# Patient Record
Sex: Male | Born: 1975 | Race: Black or African American | Hispanic: No | Marital: Married | Smoking: Never smoker
Health system: Southern US, Community
[De-identification: ages and names within clinical notes are randomized; demographics above are authoritative.]

## PROBLEM LIST (undated history)

## (undated) HISTORY — PX: APPENDECTOMY: SHX54

## (undated) HISTORY — PX: TONSILLECTOMY: SUR1361

---

## 2008-02-19 ENCOUNTER — Emergency Department (HOSPITAL_COMMUNITY): Admission: EM | Admit: 2008-02-19 | Discharge: 2008-02-19 | Payer: Self-pay | Admitting: Emergency Medicine

## 2013-12-04 ENCOUNTER — Emergency Department (INDEPENDENT_AMBULATORY_CARE_PROVIDER_SITE_OTHER)
Admission: EM | Admit: 2013-12-04 | Discharge: 2013-12-04 | Disposition: A | Discharge status: Home / Self Care | Payer: BC Managed Care – PPO | Source: Home / Self Care | Attending: Emergency Medicine | Admitting: Emergency Medicine

## 2013-12-04 ENCOUNTER — Encounter (HOSPITAL_COMMUNITY): Payer: Self-pay | Admitting: Emergency Medicine

## 2013-12-04 DIAGNOSIS — J40 Bronchitis, not specified as acute or chronic: Secondary | ICD-10-CM | POA: Diagnosis not present

## 2013-12-04 MED ORDER — OMEPRAZOLE 20 MG PO CPDR: 20.0000 mg | DELAYED_RELEASE_CAPSULE | Freq: Every day | ORAL | Status: DC

## 2013-12-04 MED ORDER — DOXYCYCLINE HYCLATE 50 MG PO CAPS: 50.0000 mg | ORAL_CAPSULE | Freq: Two times a day (BID) | ORAL | Status: DC

## 2013-12-04 NOTE — Discharge Instructions (Signed)
You likely had an infection (like a cold or bronchitis) and are just taking a long time to recover. REST as much as you can. Take the antibiotic (doxycycline) 1 pill twice a day for 10 days. Take Omeprazole (an acid blocker) daily for the next 2 weeks to see if it will help with the nighttime cough.  Follow up as needed.

## 2013-12-04 NOTE — ED Notes (Signed)
Pt  Has  Symptoms  Of  Body     Aches         Headache  Vague chest  Pain      Which  Is  intermittant             And  Worse  When he  Takes  A  Deep  Breath            For  sev  Weeks         At  This  Time  He  Is  Sitting  Upright on  Exam table  Speaking in  Complete       sentances  -   Skin  Is  Warm  And  Dry

## 2013-12-04 NOTE — ED Provider Notes (Signed)
CSN: [id number]     Arrival date & time 12/04/13  1502 History   First MD Initiated Contact with Patient 12/04/13 1516     Chief Complaint  Patient presents with  . Generalized Body Aches   (Consider location/radiation/quality/duration/timing/severity/associated sxs/prior Treatment) HPI He is here today with his wife for evaluation of cough and chest discomfort. He has been feeling poorly for the last several weeks, but has been busy with work and travel and unable to see a physician. During the last several weeks he has had intermittent headaches, abdominal discomfort, loose stools, generalized body aches. Also had nasal congestion and rhinorrhea initially. He denies any fevers, although his wife thinks he did have fevers at one point. Denies decreased appetite, nausea, vomiting. He states today he is feeling better. However, he continues to have a cough that is worse at night and some vague intermittent chest discomfort. The chest discomfort is best described as a feeling of tightness and being unable to take a deep breath. He denies any leg swelling or leg pain. He is a nonsmoker. He has no known medical problems. Heart disease does run in his family.  History reviewed. No pertinent past medical history. History reviewed. No pertinent past surgical history. History reviewed. No pertinent family history. History  Substance Use Topics  . Smoking status: Never Smoker   . Smokeless tobacco: Not on file  . Alcohol Use: Yes     Comment: occasonally    Review of Systems  Constitutional: Negative for fever, chills, activity change and appetite change.  HENT: Negative for congestion, ear pain, rhinorrhea, sore throat, trouble swallowing and voice change.   Respiratory: Positive for cough and chest tightness. Negative for wheezing.   Cardiovascular: Positive for chest pain. Negative for palpitations and leg swelling.  Gastrointestinal: Negative.   Genitourinary: Negative.   Musculoskeletal:  Positive for myalgias.  Skin: Negative.   Neurological: Negative.     Allergies  Review of patient's allergies indicates no known allergies.  Home Medications   Prior to Admission medications   Medication Sig Start Date End Date Taking? Authorizing Provider  doxycycline (VIBRAMYCIN) 50 MG capsule Take 1 capsule (50 mg total) by mouth 2 (two) times daily. 12/04/13   Charm RingsErin J Adriel Desrosier, MD  omeprazole (PRILOSEC) 20 MG capsule Take 1 capsule (20 mg total) by mouth daily. 12/04/13   Charm RingsErin J Rual Vermeer, MD   BP 146/92  Pulse 62  Temp(Src) 97.8 F (36.6 C) (Oral)  SpO2 100% Physical Exam  Constitutional: He is oriented to person, place, and time. He appears well-developed and well-nourished. No distress.  HENT:  Head: Normocephalic and atraumatic.  Right Ear: Tympanic membrane and external ear normal.  Left Ear: Tympanic membrane and external ear normal.  Nose: Nose normal.  Mouth/Throat: Oropharynx is clear and moist. No oropharyngeal exudate.  Eyes: Conjunctivae are normal. Pupils are equal, round, and reactive to light. Right eye exhibits no discharge. Left eye exhibits no discharge.  Neck: Normal range of motion. Neck supple.  Cardiovascular: Normal rate, regular rhythm and normal heart sounds.  Exam reveals no friction rub.   No murmur heard. Pulmonary/Chest: Effort normal and breath sounds normal. No respiratory distress. He has no wheezes. He has no rales.  Musculoskeletal: He exhibits no edema and no tenderness.  Lymphadenopathy:    He has no cervical adenopathy.  Neurological: He is alert and oriented to person, place, and time.  Skin: Skin is warm and dry. No rash noted. He is not diaphoretic.  ED Course  ED EKG  Date/Time: 12/04/2013 4:06 PM Performed by: Charm Rings Authorized by: Charm Rings Interpreted by ED physician Previous ECG: no previous ECG available Rhythm: sinus rhythm Rate: normal QRS axis: normal Conduction: conduction normal ST Segments: ST segments  normal T Waves: T waves normal Clinical impression: normal ECG   (including critical care time) Labs Review Labs Reviewed - No data to display  Imaging Review No results found.   MDM   1. Bronchitis    I suspect he is having a prolonged recovery from a viral bronchitis, however given the duration of symptoms will treat with doxycycline. Also provided prescription for omeprazole to see if that'll help with nighttime cough. Recommended rest. No signs of heart failure, PE, cardiac cause on exam. Followup as needed.      Charm Rings, MD 12/04/13 [phone]

## 2014-06-05 ENCOUNTER — Encounter (HOSPITAL_COMMUNITY): Payer: Self-pay | Admitting: Emergency Medicine

## 2014-06-05 ENCOUNTER — Emergency Department (HOSPITAL_COMMUNITY)
Admission: EM | Admit: 2014-06-05 | Discharge: 2014-06-05 | Disposition: A | Discharge status: Home / Self Care | Payer: Self-pay | Source: Home / Self Care | Attending: Emergency Medicine | Admitting: Emergency Medicine

## 2014-06-05 DIAGNOSIS — B349 Viral infection, unspecified: Secondary | ICD-10-CM

## 2014-06-05 DIAGNOSIS — I889 Nonspecific lymphadenitis, unspecified: Secondary | ICD-10-CM

## 2014-06-05 LAB — POCT RAPID STREP A: STREPTOCOCCUS, GROUP A SCREEN (DIRECT): NEGATIVE

## 2014-06-05 MED ORDER — CLINDAMYCIN HCL 300 MG PO CAPS: 300.0000 mg | ORAL_CAPSULE | Freq: Three times a day (TID) | ORAL | Status: DC

## 2014-06-05 MED ORDER — ACETAMINOPHEN 325 MG PO TABS
ORAL_TABLET | ORAL | Status: AC
  Filled 2014-06-05: qty 2

## 2014-06-05 MED ORDER — ACETAMINOPHEN 325 MG PO TABS
650.0000 mg | ORAL_TABLET | Freq: Once | ORAL | Status: AC
  Administered 2014-06-05: 650 mg via ORAL

## 2014-06-05 MED ORDER — SODIUM CHLORIDE 0.9 % IV BOLUS (SEPSIS)
1000.0000 mL | Freq: Once | INTRAVENOUS | Status: AC
  Administered 2014-06-05: 1000 mL via INTRAVENOUS

## 2014-06-05 NOTE — Discharge Instructions (Signed)
You likely have a virus that is causing a mono-like illness.   Your lymph node may be infected. Take Clindamycin 1 pill 3 times a day for 10 days. Drink LOTS of fluids. You should start to feel better in the next 2-3 days.  If you are not improving in 2-3 days, or your symptoms get worse, please come back.

## 2014-06-05 NOTE — ED Notes (Signed)
Fever, minimal cough, general body aches, and headache, scratchy throat.

## 2014-06-05 NOTE — ED Provider Notes (Signed)
CSN: [id number]     Arrival date & time 06/05/14  [id number] History   First MD Initiated Contact with Patient 06/05/14 1008     Chief Complaint  Patient presents with  . Fever   (Consider location/radiation/quality/duration/timing/severity/associated sxs/prior Treatment) HPI  He is a 39 year old man here with his wife for evaluation of fever. He states his fever started on Friday. His wife is giving him Tylenol and Motrin with minimal improvement. He states he's had a little bit of nasal congestion and some mild throat discomfort. He also reports headaches and body aches. He reports some mild shortness of breath. He denies sinus pressure, cough, nausea, vomiting. No rashes. He states his appetite is okay, but his wife reports he has not been taking much by mouth in the last 24 hours. No known sick contacts.  History reviewed. No pertinent past medical history. History reviewed. No pertinent past surgical history. No family history on file. History  Substance Use Topics  . Smoking status: Never Smoker   . Smokeless tobacco: Not on file  . Alcohol Use: Yes     Comment: occasonally    Review of Systems  Constitutional: Positive for fever, activity change, appetite change and fatigue.  HENT: Positive for sore throat. Negative for congestion, rhinorrhea, sinus pressure and trouble swallowing.   Respiratory: Positive for shortness of breath. Negative for cough.   Gastrointestinal: Negative for nausea, vomiting, abdominal pain and diarrhea.  Musculoskeletal: Positive for myalgias.  Neurological: Positive for dizziness and headaches.    Allergies  Review of patient's allergies indicates no known allergies.  Home Medications   Prior to Admission medications   Medication Sig Start Date End Date Taking? Authorizing Provider  clindamycin (CLEOCIN) 300 MG capsule Take 1 capsule (300 mg total) by mouth 3 (three) times daily. 06/05/14   Charm RingsErin J Shastina Rua, MD  doxycycline (VIBRAMYCIN) 50 MG capsule Take  1 capsule (50 mg total) by mouth 2 (two) times daily. 12/04/13   Charm RingsErin J Noralyn Karim, MD  omeprazole (PRILOSEC) 20 MG capsule Take 1 capsule (20 mg total) by mouth daily. 12/04/13   Charm RingsErin J Rahmel Nedved, MD   BP 136/89 mmHg  Pulse 90  Temp(Src) 101.1 F (38.4 C) (Oral)  Resp 18  SpO2 97% Physical Exam  Constitutional: He is oriented to person, place, and time. He appears well-developed and well-nourished. He appears distressed.  HENT:  Head: Normocephalic and atraumatic.  Nose: Rhinorrhea present.  Mouth/Throat: Mucous membranes are dry. Posterior oropharyngeal erythema present. No oropharyngeal exudate.    Neck: Neck supple.  Cardiovascular: Normal rate, regular rhythm and normal heart sounds.   No murmur heard. Pulmonary/Chest: Effort normal and breath sounds normal. No respiratory distress. He has no wheezes. He has no rales.  Lymphadenopathy:    He has cervical adenopathy (right posterior cervical node).  Neurological: He is alert and oriented to person, place, and time.    ED Course  Procedures (including critical care time) Labs Review Labs Reviewed  POCT RAPID STREP A ([facility] ONLY)    Imaging Review No results found.   MDM   1. Viral illness   2. Lymphadenitis    Tylenol 650 mg by mouth given. 1 L normal saline bolus given.  He is much improved after 1 L of fluids. Rapid strep is negative. He likely has a viral illness such as EBV or CMV, with possible lymphadenitis as well. We'll treat with clindamycin for 10 days. Stressed importance of fluid intake. Return precautions given as in after visit  summary.   Charm Rings, MD 06/05/14 1136

## 2014-06-05 NOTE — ED Notes (Signed)
Drinking water and eating saltines and graham crackers prior to discharge, tolerated well

## 2014-06-07 ENCOUNTER — Emergency Department (HOSPITAL_COMMUNITY)
Admission: EM | Admit: 2014-06-07 | Discharge: 2014-06-07 | Disposition: A | Discharge status: Home / Self Care | Payer: Self-pay | Attending: Emergency Medicine | Admitting: Emergency Medicine

## 2014-06-07 ENCOUNTER — Telehealth: Payer: Self-pay | Admitting: *Deleted

## 2014-06-07 ENCOUNTER — Emergency Department (HOSPITAL_COMMUNITY): Payer: Self-pay

## 2014-06-07 ENCOUNTER — Encounter (HOSPITAL_COMMUNITY): Payer: Self-pay | Admitting: Emergency Medicine

## 2014-06-07 DIAGNOSIS — K120 Recurrent oral aphthae: Secondary | ICD-10-CM

## 2014-06-07 DIAGNOSIS — R61 Generalized hyperhidrosis: Secondary | ICD-10-CM | POA: Insufficient documentation

## 2014-06-07 DIAGNOSIS — Z9049 Acquired absence of other specified parts of digestive tract: Secondary | ICD-10-CM | POA: Insufficient documentation

## 2014-06-07 DIAGNOSIS — Z792 Long term (current) use of antibiotics: Secondary | ICD-10-CM | POA: Insufficient documentation

## 2014-06-07 DIAGNOSIS — R0602 Shortness of breath: Secondary | ICD-10-CM | POA: Insufficient documentation

## 2014-06-07 DIAGNOSIS — R42 Dizziness and giddiness: Secondary | ICD-10-CM | POA: Insufficient documentation

## 2014-06-07 DIAGNOSIS — R590 Localized enlarged lymph nodes: Secondary | ICD-10-CM | POA: Insufficient documentation

## 2014-06-07 DIAGNOSIS — R591 Generalized enlarged lymph nodes: Secondary | ICD-10-CM

## 2014-06-07 DIAGNOSIS — M791 Myalgia: Secondary | ICD-10-CM | POA: Insufficient documentation

## 2014-06-07 DIAGNOSIS — R509 Fever, unspecified: Secondary | ICD-10-CM

## 2014-06-07 DIAGNOSIS — R51 Headache: Secondary | ICD-10-CM | POA: Insufficient documentation

## 2014-06-07 DIAGNOSIS — Z79899 Other long term (current) drug therapy: Secondary | ICD-10-CM | POA: Insufficient documentation

## 2014-06-07 DIAGNOSIS — Z8619 Personal history of other infectious and parasitic diseases: Secondary | ICD-10-CM | POA: Insufficient documentation

## 2014-06-07 LAB — CBC WITH DIFFERENTIAL/PLATELET
BASOS PCT: 1 % (ref 0–1)
Basophils Absolute: 0 10*3/uL (ref 0.0–0.1)
Eosinophils Absolute: 0 10*3/uL (ref 0.0–0.7)
Eosinophils Relative: 0 % (ref 0–5)
HEMATOCRIT: 40.5 % (ref 39.0–52.0)
HEMOGLOBIN: 13.7 g/dL (ref 13.0–17.0)
LYMPHS ABS: 0.7 10*3/uL (ref 0.7–4.0)
Lymphocytes Relative: 24 % (ref 12–46)
MCH: 26.3 pg (ref 26.0–34.0)
MCHC: 33.8 g/dL (ref 30.0–36.0)
MCV: 77.7 fL — ABNORMAL LOW (ref 78.0–100.0)
MONOS PCT: 19 % — AB (ref 3–12)
Monocytes Absolute: 0.6 10*3/uL (ref 0.1–1.0)
Neutro Abs: 1.7 10*3/uL (ref 1.7–7.7)
Neutrophils Relative %: 56 % (ref 43–77)
Platelets: 138 10*3/uL — ABNORMAL LOW (ref 150–400)
RBC: 5.21 MIL/uL (ref 4.22–5.81)
RDW: 13.1 % (ref 11.5–15.5)
WBC: 3 10*3/uL — ABNORMAL LOW (ref 4.0–10.5)

## 2014-06-07 LAB — COMPREHENSIVE METABOLIC PANEL
ALT: 28 U/L (ref 0–53)
AST: 46 U/L — AB (ref 0–37)
Albumin: 3.4 g/dL — ABNORMAL LOW (ref 3.5–5.2)
Alkaline Phosphatase: 79 U/L (ref 39–117)
Anion gap: 7 (ref 5–15)
BILIRUBIN TOTAL: 0.9 mg/dL (ref 0.3–1.2)
BUN: 11 mg/dL (ref 6–23)
CO2: 25 mmol/L (ref 19–32)
CREATININE: 0.99 mg/dL (ref 0.50–1.35)
Calcium: 8.3 mg/dL — ABNORMAL LOW (ref 8.4–10.5)
Chloride: 99 mmol/L (ref 96–112)
GFR calc Af Amer: >90 mL/min (ref >90)
GFR calc non Af Amer: >90 mL/min (ref >90)
GLUCOSE: 109 mg/dL — AB (ref 70–99)
POTASSIUM: 3.6 mmol/L (ref 3.5–5.1)
SODIUM: 131 mmol/L — AB (ref 135–145)
Total Protein: 6.9 g/dL (ref 6.0–8.3)

## 2014-06-07 LAB — I-STAT CHEM 8, ED
BUN: 9 mg/dL (ref 6–23)
CALCIUM ION: 1.14 mmol/L (ref 1.12–1.23)
CHLORIDE: 99 mmol/L (ref 96–112)
CREATININE: 1 mg/dL (ref 0.50–1.35)
Glucose, Bld: 111 mg/dL — ABNORMAL HIGH (ref 70–99)
HCT: 45 % (ref 39.0–52.0)
HEMOGLOBIN: 15.3 g/dL (ref 13.0–17.0)
POTASSIUM: 3.7 mmol/L (ref 3.5–5.1)
Sodium: 136 mmol/L (ref 135–145)
TCO2: 23 mmol/L (ref 0–100)

## 2014-06-07 LAB — URINALYSIS, ROUTINE W REFLEX MICROSCOPIC
BILIRUBIN URINE: NEGATIVE
Glucose, UA: NEGATIVE mg/dL
HGB URINE DIPSTICK: NEGATIVE
KETONES UR: 15 mg/dL — AB
LEUKOCYTES UA: NEGATIVE
NITRITE: NEGATIVE
PH: 6.5 (ref 5.0–8.0)
Protein, ur: NEGATIVE mg/dL
SPECIFIC GRAVITY, URINE: 1.024 (ref 1.005–1.030)
Urobilinogen, UA: 1 mg/dL (ref 0.0–1.0)

## 2014-06-07 LAB — I-STAT CG4 LACTIC ACID, ED: LACTIC ACID, VENOUS: 0.9 mmol/L (ref 0.5–2.0)

## 2014-06-07 LAB — CULTURE, GROUP A STREP

## 2014-06-07 LAB — MONONUCLEOSIS SCREEN: MONO SCREEN: NEGATIVE

## 2014-06-07 LAB — RAPID HIV SCREEN (WH-MAU): SUDS RAPID HIV SCREEN: NONREACTIVE

## 2014-06-07 MED ORDER — MAGIC MOUTHWASH W/LIDOCAINE: 5.0000 mL | Freq: Three times a day (TID) | ORAL | Status: DC | PRN

## 2014-06-07 MED ORDER — SODIUM CHLORIDE 0.9 % IV BOLUS (SEPSIS)
1000.0000 mL | Freq: Once | INTRAVENOUS | Status: AC
  Administered 2014-06-07: 1000 mL via INTRAVENOUS

## 2014-06-07 MED ORDER — KETOROLAC TROMETHAMINE 30 MG/ML IJ SOLN
30.0000 mg | Freq: Once | INTRAMUSCULAR | Status: AC
  Administered 2014-06-07: 30 mg via INTRAVENOUS
  Filled 2014-06-07: qty 1

## 2014-06-07 MED ORDER — AMOXICILLIN 500 MG PO CAPS: 500.0000 mg | ORAL_CAPSULE | Freq: Three times a day (TID) | ORAL | Status: DC

## 2014-06-07 NOTE — ED Provider Notes (Signed)
CSN: [id number]     Arrival date & time 06/07/14  0503 History   First MD Initiated Contact with Patient 06/07/14 0600     Chief Complaint  Patient presents with  . Fever     (Consider location/radiation/quality/duration/timing/severity/associated sxs/prior Treatment) HPI Comments: The patient is a 39 year old male in good health presenting emergency room chief complaint of persistent fever for 6 days. Patient reports MAXIMUM TEMPERATURE 102.6 6 days ago. He reports persistent fever unrelieved with Tylenol 650, last dose 0400 today. Denies fever pattern, just persistent all day and night. The patient reports persistent fever with associated generalized myalgias and headache. He reports lightheadedness and diaphoresis, shortness of breath with sitting up. He reports mild sneezing denies nasal congestion, dysuria, hematuria, nausea, vomiting, abdominal pain, rash, recent travel.  The patient reports decrease in oral intake, normal bowel movements. No known sick contacts, no recent travel, no immunocompromised state, no IV drug use. Patient reports recently seen at urgent care 2 days ago for similar complaints without resolution of symptoms.  Reports history of fever blisters. Reports painful oral lesions for several days. NO PCP  The history is provided by the patient. No language interpreter was used.    History reviewed. No pertinent past medical history. Past Surgical History  Procedure Laterality Date  . Appendectomy    . Tonsillectomy     No family history on file. History  Substance Use Topics  . Smoking status: Never Smoker   . Smokeless tobacco: Not on file  . Alcohol Use: Yes     Comment: occasonally    Review of Systems  Constitutional: Positive for fever, chills and diaphoresis.  HENT: Positive for mouth sores. Negative for congestion, ear pain and postnasal drip.   Respiratory: Positive for shortness of breath. Negative for cough.   Gastrointestinal: Negative for nausea,  vomiting, abdominal pain, diarrhea and constipation.  Genitourinary: Negative for dysuria and hematuria.  Musculoskeletal: Positive for myalgias.  Skin: Negative for color change and rash.  Neurological: Positive for light-headedness and headaches. Negative for numbness.      Allergies  Review of patient's allergies indicates no known allergies.  Home Medications   Prior to Admission medications   Medication Sig Start Date End Date Taking? Authorizing Provider  acetaminophen (TYLENOL) 500 MG tablet Take 1,000 mg by mouth every 4 (four) hours as needed (for fever).   Yes Historical Provider, MD  clindamycin (CLEOCIN) 300 MG capsule Take 1 capsule (300 mg total) by mouth 3 (three) times daily. 06/05/14  Yes Charm Rings, MD  doxycycline (VIBRAMYCIN) 50 MG capsule Take 1 capsule (50 mg total) by mouth 2 (two) times daily. 12/04/13   Charm Rings, MD  omeprazole (PRILOSEC) 20 MG capsule Take 1 capsule (20 mg total) by mouth daily. 12/04/13   Charm Rings, MD   BP 116/76 mmHg  Pulse 88  Temp(Src) 100.3 F (37.9 C) (Oral)  Resp 18  Ht  (1.905 m)  Wt 210 lb (95.255 kg)  BMI 26.25 kg/m2  SpO2 99% Physical Exam  Constitutional: He is oriented to person, place, and time. He appears well-developed and well-nourished. No distress.  HENT:  Head: Normocephalic and atraumatic.  Right Ear: No drainage. Tympanic membrane is injected.  Left Ear: No drainage. Tympanic membrane is not injected.  Mouth/Throat:    Ulceration to right posterior oropharynx, and right lip.  Eyes: EOM are normal. Pupils are equal, round, and reactive to light.  Neck: Normal range of motion. Neck supple. No rigidity.  Cardiovascular: Normal rate and regular rhythm.   No murmur heard. Pulmonary/Chest: Effort normal. No respiratory distress. He has no wheezes. He has no rales.  Abdominal: Soft. There is no tenderness. There is no rebound and no guarding.  Lymphadenopathy:       Head (right side): Submandibular  adenopathy present.       Head (left side): No submandibular adenopathy present.    He has cervical adenopathy.       Right cervical: Posterior cervical adenopathy present.       Left cervical: Posterior cervical adenopathy present.  Neurological: He is oriented to person, place, and time.  Skin: Skin is warm and dry. No rash noted.  No lesions to palms or soles.  Psychiatric: He has a normal mood and affect. His behavior is normal.  Nursing note and vitals reviewed.   ED Course  Procedures (including critical care time) Labs Review Labs Reviewed  CBC WITH DIFFERENTIAL/PLATELET - Abnormal; Notable for the following:    WBC 3.0 (*)    MCV 77.7 (*)    Platelets 138 (*)    Monocytes Relative 19 (*)    All other components within normal limits  URINALYSIS, ROUTINE W REFLEX MICROSCOPIC - Abnormal; Notable for the following:    Ketones, ur 15 (*)    All other components within normal limits  COMPREHENSIVE METABOLIC PANEL - Abnormal; Notable for the following:    Sodium 131 (*)    Glucose, Bld 109 (*)    Calcium 8.3 (*)    Albumin 3.4 (*)    AST 46 (*)    All other components within normal limits  I-STAT CHEM 8, ED - Abnormal; Notable for the following:    Glucose, Bld 111 (*)    All other components within normal limits  MONONUCLEOSIS SCREEN  RAPID HIV SCREEN ([facility])  I-STAT CG4 LACTIC ACID, ED    Imaging Review No results found.   EKG Interpretation None      MDM   Final diagnoses:  Fever in adult  Aphthous ulcer of mouth  Lymphadenopathy   Patient with persistent fever for 6 days, diagnosed with viral illness but prescribed antibiotics at urgent care. No meningitis signs. No obvious source of infection, plan to rule out mono, HIV, hepatitis. negative rapid strep and culture performed 3 days ago. Labs show low WBC 3.0. No comparison.  CMP shoes slightly low Na, fluid given, negative mono, negative HIV, negative chest XR and negative urine.  8:03 AM Reevaluation  patient resting complain room reports moderate resolution of symptoms, denies headache at this time.   Re-eval pt resting in room reports persistent resolution of symptoms. Discussed lab results and need to follow-up with PCP for repeat CBC and 7 days.  Also to re-evaluate lymphadenopathy for resolution vs neoplastic disease. Given oral lesions plan to treat for possible coxsackie viral illness.  Meds given in ED:  Medications  sodium chloride 0.9 % bolus 1,000 mL (0 mLs Intravenous Stopped 06/07/14 0921)  sodium chloride 0.9 % bolus 1,000 mL (0 mLs Intravenous Stopped 06/07/14 0738)  ketorolac (TORADOL) 30 MG/ML injection 30 mg (30 mg Intravenous Given 06/07/14 0735)    Discharge Medication List as of 06/07/2014  9:24 AM    START taking these medications   Details  Alum & Mag Hydroxide-Simeth (MAGIC MOUTHWASH W/LIDOCAINE) SOLN Take 5 mLs by mouth 3 (three) times daily as needed for mouth pain., Starting 06/07/2014, Until Discontinued, Print    amoxicillin (AMOXIL) 500 MG capsule Take 1 capsule (  500 mg total) by mouth 3 (three) times daily., Starting 06/07/2014, Until Discontinued, Print        Mellody DrownLauren Rommie Dunn, PA-C 06/07/14 1600  Loren Raceravid Yelverton, MD 06/10/14 [id number]

## 2014-06-07 NOTE — Telephone Encounter (Signed)
Pharmacy called to get ratio for magic mouthwash.  NCM gave her : Maalox Extra Strength; 60ml Nystatin Suspension; Benedryl; 40ml 2% Lidocaine.

## 2014-06-07 NOTE — ED Notes (Addendum)
Patient c/o fever (highest 102.9), dizziness, decreased appetite, SOB x3 days. Patient c/o generalized pain, denies N/V/D.

## 2014-06-07 NOTE — ED Notes (Signed)
Pt c/o fever, general malaise, +nausea, +shob x 6 days. Pt has been taking APAP q4 hours fever continues to be 102-103. Pt is also taking Clindamycin. [facility]

## 2014-06-07 NOTE — Discharge Instructions (Signed)
Call for a follow up appointment with a Family or Primary Care Provider, to repeat CBC blood work in 1 week. Return if Symptoms worsen.   Take medication as prescribed.  You can alternate Ibuprofen 800 mg and tylenol 650 mg for fever and body aches. Salt water gargles for mouth ulcers. Drink plenty of fluid.   Emergency Department Resource Guide 1) Find a Doctor and Pay Out of Pocket Although you won't have to find out who is covered by your insurance plan, it is a good idea to ask around and get recommendations. You will then need to call the office and see if the doctor you have chosen will accept you as a new patient and what types of options they offer for patients who are self-pay. Some doctors offer discounts or will set up payment plans for their patients who do not have insurance, but you will need to ask so you aren't surprised when you get to your appointment.  2) Contact Your Local Health Department Not all health departments have doctors that can see patients for sick visits, but many do, so it is worth a call to see if yours does. If you don't know where your local health department is, you can check in your phone book. The CDC also has a tool to help you locate your state's health department, and many state websites also have listings of all of their local health departments.  3) Find a Walk-in Clinic If your illness is not likely to be very severe or complicated, you may want to try a walk in clinic. These are popping up all over the country in pharmacies, drugstores, and shopping centers. They're usually staffed by nurse practitioners or physician assistants that have been trained to treat common illnesses and complaints. They're usually fairly quick and inexpensive. However, if you have serious medical issues or chronic medical problems, these are probably not your best option.  No Primary Care Doctor: - Call Health Connect at  [phone] - they can help you locate a primary care  doctor that  accepts your insurance, provides certain services, etc. - Physician Referral Service- [phone]  Chronic Pain Problems: Organization         Address  Phone   Notes  [facility]  [phone] Patients need to be referred by their primary care doctor.   Medication Assistance: Organization         Address  Phone   Notes  [facility] [address] [place], [place] [address] [phone]) [phone] --Must be a resident of [place] -- Must have NO insurance coverage whatsoever (no Medicaid/ Medicare, etc.) -- The pt. MUST have a primary care doctor that directs their care regularly and follows them in the community   MedAssist  [phone]) [phone]   [employer]  [phone]    Agencies that provide inexpensive medical care: Organization         Address  Phone   Notes  [facility]  [phone]) [phone]   [facility]    [phone]   [facility] [address] [place], [place] [address] [phone]) [phone]   [facility] [address], [place] [phone]   [facility]anned [facility]    [phone]   [facility]    [phone]   [facility]  [address], [place] Phone:  [phone], Fax:  [phone] Hours of Operation:  9 am - 6 pm, M-F.  Also accepts Medicaid/Medicare and self-pay.  [facility] for [place] [place], [address], [place] Phone: [phone], Fax: [phone]. Hours of Operation:  8:30 am - 5:30 pm, M-F.  Also accepts Medicaid and self-pay.  [facility] [address] [place] Phone: [phone]   [place], [place], [place] [phone], Ext. [phone] Mondays & Thursdays: 7-9 AM.  First 15 patients are seen on a first come, first serve basis.    [place]  Providers:  Organization         Address  Phone   Notes  [facility] [address], [place] [phone] Also accepts self-pay patients.  [facility] [address], [place]  [phone]   [place], [address], [place] [phone]   [facility] [address], [place] [phone]   Lucianne Lei [address], [place]   [phone] Only accepts [place] Access [place] patients after they have their name applied to their card.   Self-Pay (no insurance) in [facility]:  Organization         Address  Phone   Notes  Sickle Cell Patients, [facility] [place] [phone]   [facility] [place] [phone]   [facility] [place]  [place], [place], [place] [phone]   [facility]/Dr. Osei-Bonsu  [address], [place] or [address], [place] [phone] Phone number for both [place] and [place] locations is the same.  Urgent Medical and [facility] [address], [place] [phone]   [facility] [address] [place] or [address] [phone]   [facility] [address], [place] [phone], phone; [phone], fax Sees patients 1st and 3rd Saturday of every month.  Must not qualify for public or private insurance (i.e. Medicaid, Medicare, [place] Health Choice, Veterans' Benefits)  Household income should be no more than 200% of the poverty level The clinic cannot treat you if you are pregnant or think you are pregnant  Sexually transmitted diseases are not treated at the clinic.    Dental Care: Organization         Address  Phone  Notes  [facility] Department of [facility] [place] [phone] Accepts children up to age 38 who are enrolled in [place] or [place]; pregnant women with a Medicaid card; and children who have applied for Medicaid or [place] Health Choice, but were declined, whose parents can pay a reduced fee at time of service.  [facility] Department of [facility]  [address], [place] [phone] Accepts children up to age 17 who are enrolled in [place] or [place]; pregnant women with a Medicaid card; and children who have applied for Medicaid or [place] Health Choice, but were declined, whose parents can pay a reduced fee at time of service.  [facility] Access PROGRAM  [place] [phone] Patients are seen by appointment only. Walk-ins are not accepted. [place]  [facility] will see patients 74 years of age and older. Monday - Tuesday (8am-5pm) Most Wednesdays (8:30-5pm) $30 per visit, cash only  [facility] Adult Dental Access PROGRAM  [address], [facility] [phone] Patients are seen by appointment only. Walk-ins are not accepted. [place] will see patients 50 years of age and older. One Wednesday Evening (Monthly: Volunteer Based).  $30 per visit, cash only  [place]  [phone] for adults; Children under age 23, call Graduate Pediatric Dentistry at [phone]. Children aged 20-14, please call [phone] to request a pediatric application.  Dental services are provided in all areas of dental care including fillings, crowns and bridges, complete and partial dentures, implants, gum treatment, root canals, and extractions. Preventive care is also provided. Treatment is provided to both adults and children. Patients are selected via a lottery and there is often a waiting list.   [facility] [address], [place]  [phone] [email or web address]   [facility]  [address] [place], [place] [phone], Ext. [phone] Second and Fourth Thursday of each month, opens at 6:30 AM; Clinic ends at 9 AM.  Patients are seen on a first-come first-served basis, and a limited number are seen during each clinic.   [facility]  [address] Hillard Danker [place], [place] [phone]   Eligibility Requirements You must have lived in [place], [place], or [place] counties for at least the last three months.   You cannot be eligible for state or federal sponsored [employer], including [employer], [place], or [employer].   You generally cannot be eligible for healthcare insurance through your employer.    How to apply: Eligibility screenings are held every Tuesday and Wednesday afternoon from 1:00 pm until 4:00 pm. You do not need an appointment for the interview!  [facility] [address], [place], [place]   [place]  [place] Department  [place]  [phone]    Behavioral Health Resources in the Community: Intensive Outpatient Programs Organization         Address  Phone  Notes  [place]. [address] [place], [place] [phone]   [facility] Outpatient [address] [place], [place]   ADS: Alcohol [facility] Drug S[facility] [address], [place], [place]   [place] [address]  [place], [place] or [phone]   Substance Abuse Resources Organization         Address  Phone  Notes  Alcohol and Drug Services  [phone]   [place]  [phone]   The [place]   Chinita Pester  [phone]   Residential & Outpatient Substance Abuse Program  [phone]   Psychological Services Organization         Address  Phone  Notes  [facility] [place]  [place]  [phone]   [place] [address] [place] or [phone]    Mobile Crisis Teams Organization         Address  Phone  Notes  [facility], Mobile Crisis Care Unit  [phone]   [facility] Psychotherapeutic [facility]  [address] [place], [place]   [facility] [address] [place] [phone]    Self-Help/Support Groups Organization  Address  Phone             Notes  [place]. [facility] [place] - variety of support groups  Lucien Call for more information  Narcotics Anonymous (NA), [facility] [address], [employer] [place]  2 meetings at this location   Special educational needs teacher         Address  Phone  Notes  [facility] [place],    [place]  [phone]   [facility]  [address] [place] [address]462, [place], [place]   [place], [place] [phone] Admissions: 8am-3pm M-F  Incentives Substance [place] [address],    [place], [place] [phone]   [facility] [address] [place], [place], [place]   [facility] [address],  [place], [place]   [facility] - Intensive Outpatient [address], [place], [place]   [facility] [place].,  [place], [place] [phone] or [phone]   [facility] Services (RTS) [address], [place], [place] Accepts Medicaid  [facility] [address],  [place] [phone] Substance Abuse/Addiction Treatment   [facility] Organization         Address  Phone  Notes  [facility]  [phone]   Domenic Schwab, PhD [address] Arlis Porta [place], [place]   [phone] or [phone]    [address] [facility] [place], [place] [phone]   [facility] [address], [place], [place] [phone] Insurance/Medicaid/sponsorship through [facility] and Families [address], [address] [place]                                    [place], [place] [phone] [place] [address] [place], [place] [phone]    Dr. Adele Schilder  [phone]   [facility] Dept. 1) [address] [place] 2) [place] 3)  [place] [address], [place] [phone]  [phone]   [place] [phone] or [phone] (After Hours)

## 2014-06-07 NOTE — ED Notes (Signed)
Rapid HIV result is non reactive. Carlos [facility]hon in [facility]

## 2017-10-18 ENCOUNTER — Encounter (HOSPITAL_COMMUNITY): Payer: Self-pay | Admitting: Emergency Medicine

## 2017-10-18 ENCOUNTER — Telehealth (HOSPITAL_COMMUNITY): Payer: Self-pay | Admitting: Emergency Medicine

## 2017-10-18 ENCOUNTER — Ambulatory Visit (HOSPITAL_COMMUNITY)
Admission: EM | Admit: 2017-10-18 | Discharge: 2017-10-18 | Disposition: A | Discharge status: Home / Self Care | Payer: Self-pay | Attending: Family Medicine | Admitting: Family Medicine

## 2017-10-18 DIAGNOSIS — R197 Diarrhea, unspecified: Secondary | ICD-10-CM | POA: Insufficient documentation

## 2017-10-18 MED ORDER — CHOLESTYRAMINE 4 G PO PACK: 4.0000 g | PACK | Freq: Two times a day (BID) | ORAL | 0 refills | Status: DC

## 2017-10-18 NOTE — ED Triage Notes (Signed)
Pt c/o diarrhea x1.5 weeks.

## 2017-10-18 NOTE — Telephone Encounter (Signed)
Please call patient or wife, [name], with results of GI panel.  Phone number is [phone]

## 2017-10-18 NOTE — ED Provider Notes (Addendum)
[facility]   [id number] 10/18/17 Arrival Time: 1209  SUBJECTIVE:  Carlos Mcmahon is a 42 y.o. male who presents with complaint of worsening diarrhea that began abruptly 1.5 weeks ago.  Denies a precipitating event, recent antibiotic use, or traveling.  Denies anyone in the home with similar symptoms.  Described the diarrhea as watery, and minimal to no formed stools.  Complains of some mild associated abdominal discomfort he describes as a "muscle ache."  Has tried OTC medications including immodium, pepto, and antidiarrhea medication without relief.  Denies alleviating factors.  Symptoms are made worse with eating and drinking.  Denies similar symptoms in the past.  Last BM this am.  Complains of fatigue, decreased appetite, and 10lbs weight loss.    Denies fever, chills,  nausea, vomiting, chest pain, SOB,constipation, hematochezia, melena, dysuria, difficulty urinating, increased frequency or urgency, flank pain, loss of bowel or bladder function.  No LMP for male patient.  ROS: As per HPI.  History reviewed. No pertinent past medical history. Past Surgical History:  Procedure Laterality Date  . APPENDECTOMY    . TONSILLECTOMY     No Known Allergies No current facility-administered medications on file prior to encounter.    Current Outpatient Medications on File Prior to Encounter  Medication Sig Dispense Refill  . acetaminophen (TYLENOL) 500 MG tablet Take 1,000 mg by mouth every 4 (four) hours as needed (for fever).    . Alum & Mag Hydroxide-Simeth (MAGIC MOUTHWASH W/LIDOCAINE) SOLN Take 5 mLs by mouth 3 (three) times daily as needed for mouth pain. (Patient not taking: Reported on 10/18/2017) 30 mL 0  . amoxicillin (AMOXIL) 500 MG capsule Take 1 capsule (500 mg total) by mouth 3 (three) times daily. (Patient not taking: Reported on 10/18/2017) 21 capsule 0   Social History   Socioeconomic History  . Marital status: Married    Spouse name: Not on file  . Number of  children: Not on file  . Years of education: Not on file  . Highest education level: Not on file  Occupational History  . Not on file  Social Needs  . Financial resource strain: Not on file  . Food insecurity:    Worry: Not on file    Inability: Not on file  . Transportation needs:    Medical: Not on file    Non-medical: Not on file  Tobacco Use  . Smoking status: Never Smoker  Substance and Sexual Activity  . Alcohol use: Yes    Comment: occasonally  . Drug use: No  . Sexual activity: Not on file  Lifestyle  . Physical activity:    Days per week: Not on file    Minutes per session: Not on file  . Stress: Not on file  Relationships  . Social connections:    Talks on phone: Not on file    Gets together: Not on file    Attends religious service: Not on file    Active member of club or organization: Not on file    Attends meetings of clubs or organizations: Not on file    Relationship status: Not on file  . Intimate partner violence:    Fear of current or ex partner: Not on file    Emotionally abused: Not on file    Physically abused: Not on file    Forced sexual activity: Not on file  Other Topics Concern  . Not on file  Social History Narrative  . Not on file   No family  history on file.   OBJECTIVE:  Vitals:   10/18/17 1229  BP: 137/83  Pulse: 80  Resp: 16  Temp: 99.5 F (37.5 C)  TempSrc: Oral  SpO2: 99%    General appearance: AOx3 in no acute distress; appears stated age HEENT: NCAT.  Oropharynx clear.  Lungs: clear to auscultation bilaterally without adventitious breath sounds Heart: regular rate and rhythm.  Radial pulses 2+ symmetrical bilaterally Abdomen: soft, non-distended; hyperactive bowel sounds; non-tender to light and deep palpation; negative Murphy's sign; negative rebound; no guarding Back: no CVA tenderness Extremities: no edema; symmetrical with no gross deformities Skin: warm and dry Neurologic: normal gait Psychological: alert and  cooperative; normal mood and affect  Labs: No results found for this or any previous visit (from the past 24 hour(s)).   ASSESSMENT & PLAN:  1. Diarrhea of presumed infectious origin     Meds ordered this encounter  Medications  . cholestyramine (QUESTRAN) 4 g packet    Sig: Take 1 packet (4 g total) by mouth 2 (two) times daily.    Dispense:  60 each    Refill:  0    Order Specific Question:   Supervising Provider    Answer:   Isa RankinMURRAY, LAURA WILSON [[id number]][[id number]]    Drink fluids and get rest Avoid dairy products, fatty and greasy foods. Cholestyramine prescribed.  Take twice daily with juice.   Patient will collect stool sample at home and return.  We will follow up with you regarding the results Return or go to the ER if you have any new or worsening symptoms  Reviewed expectations re: course of current medical issues. Questions answered. Outlined signs and symptoms indicating need for more acute intervention. Patient verbalized understanding. After Visit Summary given.   Rennis HardingWurst, Danah Reinecke, PA-C 10/18/17 1329    Alvino ChapelWurst, [place], PA-C 10/18/17 1333

## 2017-10-18 NOTE — Discharge Instructions (Addendum)
Drink fluids and get rest Avoid dairy products, fatty and greasy foods. Cholestyramine prescribed.  Take twice daily with juice.   Patient will collect stool sample at home and return.  We will follow up with you regarding the results Return or go to the ER if you have any new or worsening symptoms

## 2017-10-20 LAB — GASTROINTESTINAL PANEL BY PCR, STOOL (REPLACES STOOL CULTURE)
Adenovirus F40/41: NOT DETECTED
Astrovirus: NOT DETECTED
CAMPYLOBACTER SPECIES: NOT DETECTED
Cryptosporidium: NOT DETECTED
Cyclospora cayetanensis: NOT DETECTED
ENTEROTOXIGENIC E COLI (ETEC): DETECTED — AB
Entamoeba histolytica: NOT DETECTED
Enteroaggregative E coli (EAEC): NOT DETECTED
Enteropathogenic E coli (EPEC): NOT DETECTED
Giardia lamblia: NOT DETECTED
NOROVIRUS GI/GII: NOT DETECTED
Plesimonas shigelloides: NOT DETECTED
Rotavirus A: NOT DETECTED
SALMONELLA SPECIES: NOT DETECTED
SAPOVIRUS (I, II, IV, AND V): NOT DETECTED
SHIGELLA/ENTEROINVASIVE E COLI (EIEC): NOT DETECTED
Shiga like toxin producing E coli (STEC): NOT DETECTED
Vibrio cholerae: NOT DETECTED
Vibrio species: NOT DETECTED
Yersinia enterocolitica: NOT DETECTED

## 2018-11-06 ENCOUNTER — Other Ambulatory Visit: Payer: Self-pay

## 2018-11-06 ENCOUNTER — Emergency Department (HOSPITAL_COMMUNITY): Payer: HRSA Program

## 2018-11-06 ENCOUNTER — Emergency Department (HOSPITAL_COMMUNITY)
Admission: EM | Admit: 2018-11-06 | Discharge: 2018-11-06 | Disposition: A | Discharge status: Home / Self Care | Payer: HRSA Program | Attending: Emergency Medicine | Admitting: Emergency Medicine

## 2018-11-06 DIAGNOSIS — R630 Anorexia: Secondary | ICD-10-CM | POA: Diagnosis not present

## 2018-11-06 DIAGNOSIS — J029 Acute pharyngitis, unspecified: Secondary | ICD-10-CM | POA: Insufficient documentation

## 2018-11-06 DIAGNOSIS — R509 Fever, unspecified: Secondary | ICD-10-CM | POA: Insufficient documentation

## 2018-11-06 DIAGNOSIS — R519 Headache, unspecified: Secondary | ICD-10-CM

## 2018-11-06 DIAGNOSIS — U071 COVID-19: Secondary | ICD-10-CM | POA: Insufficient documentation

## 2018-11-06 DIAGNOSIS — R05 Cough: Secondary | ICD-10-CM | POA: Diagnosis present

## 2018-11-06 LAB — GROUP A STREP BY PCR: Group A Strep by PCR: NOT DETECTED

## 2018-11-06 NOTE — ED Provider Notes (Signed)
[facility]   CSN: [id number] Arrival date & time: 11/06/18  2054    History   Chief Complaint Chief Complaint  Patient presents with  . Cough    HPI Carlos Mcmahon is a 43 y.o. male.     The history is provided by the patient and medical records. No language interpreter was used.  Cough Associated symptoms: chills, fever and sore throat   Associated symptoms: no chest pain, no myalgias, no rash, no shortness of breath and no wheezing    Carlos Mcmahon is a 43 y.o. male  with no pertinent PMH who presents to the Emergency Department complaining of fever and decreased appetite x 3 days. Tmax of 101 at home.  He reports dry cough and fatigue as well.  No abdominal pain, vomiting or diarrhea.  Per triage note, did complain of nausea, but tells me that he has not felt nauseous it is more that he just has no appetite and does not want to eat. He does report a nasal congestion and mild sore throat as well.  He had a headache on Thursday and Friday which has improved today.  Overall, he feels much better today than he has the last 2 days.   He denies any known sick contacts or coronavirus exposures.  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  He has been maintaining social distancing requirements and feels as if he has been taking appropriate precautions.  No past medical history on file.  There are no active problems to display for this patient.   Past Surgical History:  Procedure Laterality Date  . APPENDECTOMY    . TONSILLECTOMY          Home Medications    Prior to Admission medications   Medication Sig Start Date End Date Taking? Authorizing Provider  acetaminophen (TYLENOL) 500 MG tablet Take 1,000 mg by mouth every 4 (four) hours as needed (for fever).   Yes [provider]    Family History No family history on file.  Social  History Social History   Tobacco Use  . Smoking status: Never Smoker  Substance Use Topics  . Alcohol use: Yes    Comment: occasonally  . Drug use: No     Allergies   Patient has no known allergies.   Review of Systems Review of Systems  Constitutional: Positive for appetite change (Decreased), chills and fever.  HENT: Positive for congestion and sore throat.   Respiratory: Positive for cough. Negative for shortness of breath and wheezing.   Cardiovascular: Negative for chest pain, palpitations and leg swelling.  Gastrointestinal: Negative for abdominal pain, constipation, diarrhea, nausea and vomiting.  Genitourinary: Negative for difficulty urinating and dysuria.  Musculoskeletal: Negative for arthralgias and myalgias.  Skin: Negative for rash.     Physical Exam Updated Vital Signs BP (!) 142/96 (BP Location: Right Arm)   Pulse 61   Temp 98.4 F (36.9 C) (Oral)   Resp 18   Ht 6\' 3"  (1.905 m)   Wt 104.3 kg   SpO2 100%   BMI 28.75 kg/m   Physical Exam Vitals signs and nursing note reviewed.  Constitutional:      General: He is not in acute distress.    Appearance: He is well-developed.     Comments: Nontoxic-appearing.  HENT:     Head: Normocephalic and atraumatic.     Mouth/Throat:  Comments: Oropharynx with erythema, but no tonsillar hypertrophy or exudates. Neck:     Musculoskeletal: Normal range of motion and neck supple.     Comments: No meningeal signs. Cardiovascular:     Rate and Rhythm: Normal rate and regular rhythm.     Heart sounds: Normal heart sounds. No murmur.  Pulmonary:     Effort: Pulmonary effort is normal. No respiratory distress.     Breath sounds: Normal breath sounds.     Comments: Lungs clear to auscultation bilaterally. Abdominal:     General: There is no distension.     Palpations: Abdomen is soft.     Tenderness: There is no abdominal tenderness.     Comments: No abdominal tenderness.  Skin:    General: Skin is warm  and dry.  Neurological:     Mental Status: He is alert and oriented to person, place, and time.      ED Treatments / Results  Labs (all labs ordered are listed, but only abnormal results are displayed) Labs Reviewed  GROUP A STREP BY PCR  NOVEL CORONAVIRUS, NAA (HOSPITAL ORDER, SEND-OUT TO REF LAB)    EKG None  Radiology Dg Chest Portable 1 View  Result Date: 11/06/2018 CLINICAL DATA:  43 year old male with cough and fever. EXAM: PORTABLE CHEST 1 VIEW COMPARISON:  Chest radiograph dated 06/07/2014 FINDINGS: The heart size and mediastinal contours are within normal limits. Both lungs are clear. The visualized skeletal structures are unremarkable. IMPRESSION: No active disease. Electronically Signed   By: Anner Crete M.D.   On: 11/06/2018 23:01    Procedures Procedures (including critical care time)  Medications Ordered in ED Medications - No data to display   Initial Impression / Assessment and Plan / ED Course  I have reviewed the triage vital signs and the nursing notes.  Pertinent labs & imaging results that were available during my care of the patient were reviewed by me and considered in my medical decision making (see chart for details).       Carlos Mcmahon is a 43 y.o. male who presents to ED for fever (Tmax 101), cough, congestion, headache x 3 days. Actually feels much better today than the two days previously, but developed a sore throat this morning. On exam, he is afebrile, non-toxic appearing, VSS with clear lung exam. OP with erythema but no tonsillar hypertrophy or exudates. No meningeal signs. Benign abdominal exam. Strep PCR negative. CXR independently reviewed by me: no PNA. No acute cardiopulmonary disease. Covid sent. Patient aware that results will be available in 24-48 hours. On re-eval, patient appears quite well. Vitals have remained stable. Will treat symptomatically. Self quarantine until [employer]. PCP follow up and return precautions  discussed. All questions answered.   Carlos Mcmahon was evaluated in Emergency Department on 11/06/2018 for the symptoms described in the history of present illness. He was evaluated in the context of the global COVID-19 pandemic, which necessitated consideration that the patient might be at risk for infection with the SARS-CoV-2 virus that causes COVID-19. Institutional protocols and algorithms that pertain to the evaluation of patients at risk for COVID-19 are in a state of rapid change based on information released by regulatory bodies including the CDC and federal and state organizations. These policies and algorithms were followed during the patient's care in the ED.   Final Clinical Impressions(s) / ED Diagnoses   Final diagnoses:  Acute nonintractable headache, unspecified headache type  Decreased appetite  Fever, unspecified fever cause  ED Discharge Orders    None       Kingdom Vanzanten, Chase PicketJaime Pilcher, PA-C 11/06/18 2315    Alvira MondaySchlossman, Erin, MD 11/08/18 [id number]

## 2018-11-06 NOTE — Discharge Instructions (Signed)
It was my pleasure taking care of you today!   Your strep test was negative and your chest x-ray showed no signs of pneumonia.   As we discussed, your coronavirus test has been sent. That typically takes 24-48 hours to return. You can log into mychart to look at results.   Follow up with your primary care doctor if you are not feeling better by Monday.   Return to ER for new or worsening symptoms, any additional concerns.

## 2018-11-06 NOTE — ED Triage Notes (Signed)
Patient has had a fever of 101 everyday since Thursday. Took medicine to help with the fever and it helped. He feels fatigued, has a nonproductive cough, and has lost his appetite. He denies V/D but complains of nausea.

## 2018-11-09 LAB — NOVEL CORONAVIRUS, NAA (HOSP ORDER, SEND-OUT TO REF LAB; TAT 18-24 HRS): SARS-CoV-2, NAA: DETECTED — AB

## 2018-12-31 ENCOUNTER — Encounter (HOSPITAL_BASED_OUTPATIENT_CLINIC_OR_DEPARTMENT_OTHER): Payer: Self-pay | Admitting: *Deleted

## 2018-12-31 ENCOUNTER — Emergency Department (HOSPITAL_BASED_OUTPATIENT_CLINIC_OR_DEPARTMENT_OTHER)
Admission: EM | Admit: 2018-12-31 | Discharge: 2019-01-01 | Disposition: A | Discharge status: Home / Self Care | Payer: Self-pay | Attending: Emergency Medicine | Admitting: Emergency Medicine

## 2018-12-31 ENCOUNTER — Other Ambulatory Visit: Payer: Self-pay

## 2018-12-31 DIAGNOSIS — K029 Dental caries, unspecified: Secondary | ICD-10-CM | POA: Insufficient documentation

## 2018-12-31 NOTE — ED Triage Notes (Signed)
He was seen by his dentist this am for dental pain. He had xrays and he was referred to an oral surgeon that he will see on Tuesday. He is here for toothache tonight.

## 2019-01-01 ENCOUNTER — Encounter (HOSPITAL_BASED_OUTPATIENT_CLINIC_OR_DEPARTMENT_OTHER): Payer: Self-pay | Admitting: Emergency Medicine

## 2019-01-01 MED ORDER — PENICILLIN V POTASSIUM 250 MG PO TABS
500.0000 mg | ORAL_TABLET | Freq: Once | ORAL | Status: AC
  Administered 2019-01-01: 500 mg via ORAL
  Filled 2019-01-01: qty 2

## 2019-01-01 MED ORDER — NAPROXEN 375 MG PO TABS: 375.0000 mg | ORAL_TABLET | Freq: Two times a day (BID) | ORAL | 0 refills | Status: AC

## 2019-01-01 MED ORDER — PENICILLIN V POTASSIUM 500 MG PO TABS: 500.0000 mg | ORAL_TABLET | Freq: Four times a day (QID) | ORAL | 0 refills | Status: AC

## 2019-01-01 MED ORDER — NAPROXEN 250 MG PO TABS
500.0000 mg | ORAL_TABLET | Freq: Once | ORAL | Status: AC
  Administered 2019-01-01: 500 mg via ORAL
  Filled 2019-01-01: qty 2

## 2019-01-01 NOTE — ED Notes (Signed)
Pt understood dc material. NAD noted. Scripts sent electronically. All questions answered to satisfaction. Pt escorted to check out window 

## 2019-01-01 NOTE — ED Provider Notes (Signed)
[facility] EMERGENCY DEPARTMENT Provider Note   CSN: [id number] Arrival date & time: 12/31/18  2137     History   Chief Complaint Chief Complaint  Patient presents with  . Dental Pain    HPI Carlos Mcmahon is a 43 y.o. male.     The history is provided by the patient.  Dental Pain Location:  Upper Upper teeth location:  3/RU 1st molar and 4/RU 2nd bicuspid Quality:  Aching Severity:  Severe Onset quality:  Gradual Timing:  Constant Progression:  Unchanged Chronicity:  New Context: dental caries and dental fracture   Previous work-up:  Dental exam Relieved by:  Nothing Worsened by:  Nothing Ineffective treatments:  None tried Associated symptoms: no drooling, no facial swelling, no fever, no headaches, no neck pain, no neck swelling, no oral bleeding and no trismus   Risk factors: no immunosuppression   Seen by dentist but no meds prescribed.  Patient is here for pain in teeth.   History reviewed. No pertinent past medical history.  There are no active problems to display for this patient.   Past Surgical History:  Procedure Laterality Date  . APPENDECTOMY    . TONSILLECTOMY          Home Medications    Prior to Admission medications   Medication Sig Start Date End Date Taking? Authorizing Provider  acetaminophen (TYLENOL) 500 MG tablet Take 1,000 mg by mouth every 4 (four) hours as needed (for fever).    [provider]    Family History No family history on file.  Social History Social History   Tobacco Use  . Smoking status: Never Smoker  . Smokeless tobacco: Never Used  Substance Use Topics  . Alcohol use: Yes    Comment: occasonally  . Drug use: No     Allergies   Patient has no known allergies.   Review of Systems Review of Systems  Constitutional: Negative for fever.  HENT: Positive for dental problem. Negative for drooling and facial swelling.   Eyes: Negative for visual disturbance.  Respiratory:  Negative for shortness of breath.   Cardiovascular: Negative for chest pain.  Gastrointestinal: Negative for abdominal pain.  Genitourinary: Negative for difficulty urinating.  Musculoskeletal: Negative for arthralgias and neck pain.  Neurological: Negative for dizziness and headaches.  Psychiatric/Behavioral: Negative for agitation.  All other systems reviewed and are negative.    Physical Exam Updated Vital Signs BP (!) 149/101 (BP Location: Right Arm)   Pulse (!) 58   Temp 97.9 F (36.6 C) (Oral)   Resp 18   Ht 6\' 3"  (1.905 m)   Wt 104.3 kg   SpO2 100%   BMI 28.75 kg/m   Physical Exam Vitals signs and nursing note reviewed.  Constitutional:      Appearance: He is normal weight.  HENT:     Head: Normocephalic and atraumatic.     Nose: Nose normal.     Mouth/Throat:     Mouth: Mucous membranes are moist.     Dentition: Abnormal dentition. Dental caries present. No gingival swelling or dental abscesses.     Pharynx: Oropharynx is clear.  Eyes:     Conjunctiva/sclera: Conjunctivae normal.     Pupils: Pupils are equal, round, and reactive to light.  Neck:     Musculoskeletal: Normal range of motion and neck supple.  Cardiovascular:     Rate and Rhythm: Normal rate and regular rhythm.     Pulses: Normal pulses.     Heart  sounds: Normal heart sounds.  Pulmonary:     Effort: Pulmonary effort is normal.     Breath sounds: Normal breath sounds.  Abdominal:     General: Abdomen is flat. Bowel sounds are normal.     Tenderness: There is no abdominal tenderness. There is no guarding or rebound.  Musculoskeletal: Normal range of motion.  Skin:    General: Skin is warm and dry.     Capillary Refill: Capillary refill takes less than 2 seconds.  Neurological:     General: No focal deficit present.     Mental Status: He is alert and oriented to person, place, and time.  Psychiatric:        Mood and Affect: Mood normal.        Behavior: Behavior normal.      ED  Treatments / Results  Labs (all labs ordered are listed, but only abnormal results are displayed) Labs Reviewed - No data to display  EKG None  Radiology No results found.  Procedures Procedures (including critical care time)  Medications Ordered in ED Medications  penicillin v potassium (VEETID) tablet 500 mg (has no administration in time range)  naproxen (NAPROSYN) tablet 500 mg (has no administration in time range)     Patient seen by dentist earlier today and referred to an oral surgeon.  Will will start Penicillin and NSAIDs.  No signs of systemic infection.    Carlos Mcmahon was evaluated in Emergency Department on 01/01/2019 for the symptoms described in the history of present illness. He was evaluated in the context of the global COVID-19 pandemic, which necessitated consideration that the patient might be at risk for infection with the SARS-CoV-2 virus that causes COVID-19. Institutional protocols and algorithms that pertain to the evaluation of patients at risk for COVID-19 are in a state of rapid change based on information released by regulatory bodies including the CDC and federal and state organizations. These policies and algorithms were followed during the patient's care in the ED.   Final Clinical Impressions(s) / ED Diagnoses    Return for intractable cough, coughing up blood,fevers >100.4 unrelieved by medication, shortness of breath, intractable vomiting, chest pain, shortness of breath, weakness,numbness, changes in speech, facial asymmetry,abdominal pain, passing out,Inability to tolerate liquids or food, cough, altered mental status or any concerns. No signs of systemic illness or infection. The patient is nontoxic-appearing on exam and vital signs are within normal limits.   I have reviewed the triage vital signs and the nursing notes. Pertinent labs &imaging results that were available during my care of the patient were reviewed by me and  considered in my medical decision making (see chart for details).  After history, exam, and medical workup I feel the patient has been appropriately medically screened and is safe for discharge home. Pertinent diagnoses were discussed with the patient. Patient was given return precautions    Khiley Lieser, MD 01/01/19 0430

## 2021-04-24 IMAGING — DX PORTABLE CHEST - 1 VIEW
1 series · 1 of 1 positions shown · non-contrast
Comparison: Chest radiograph dated 06/07/2014

CLINICAL DATA: 42-year-old male with cough and fever.

EXAM:
PORTABLE CHEST 1 VIEW

[chest ap]
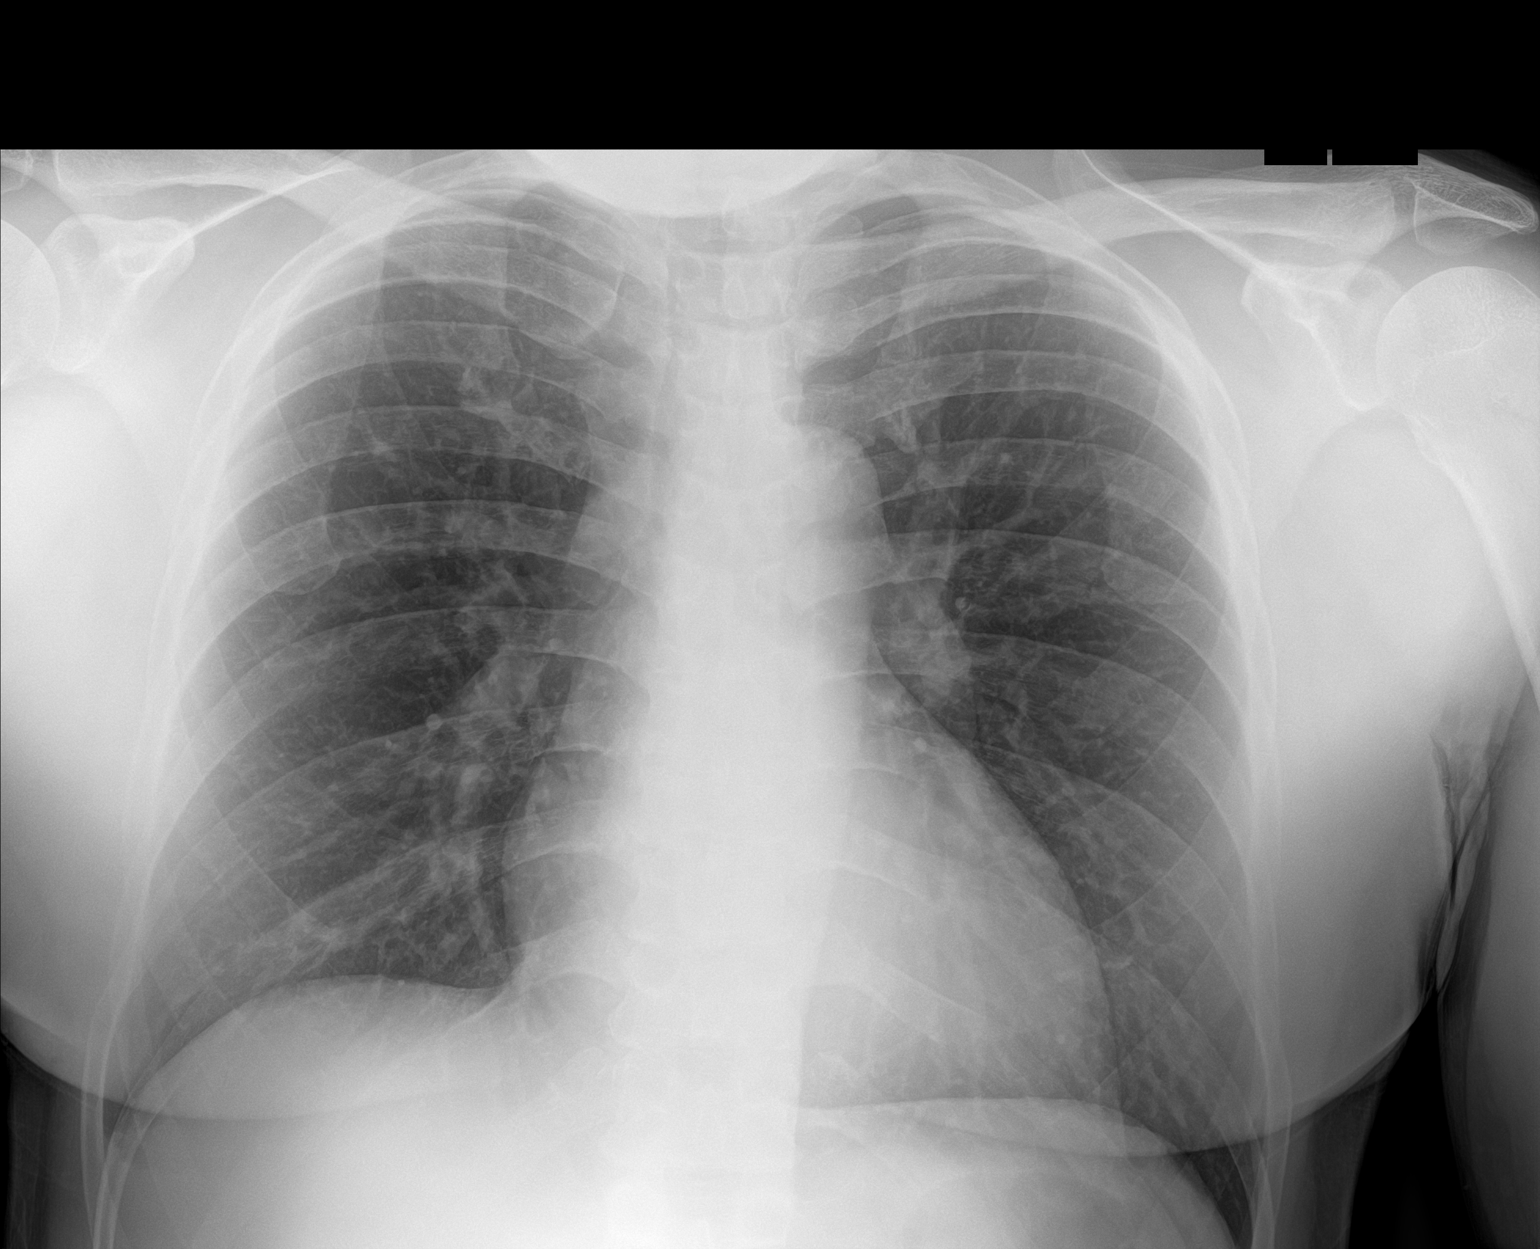

[1 of 1 positions shown; findings below may reference images not displayed]

FINDINGS: The heart size and mediastinal contours are within normal limits.
Both lungs are clear. The visualized skeletal structures are
unremarkable.
IMPRESSION: No active disease.

## 2023-06-17 ENCOUNTER — Encounter (HOSPITAL_COMMUNITY): Payer: Self-pay

## 2023-06-17 ENCOUNTER — Ambulatory Visit (HOSPITAL_COMMUNITY)
Admission: EM | Admit: 2023-06-17 | Discharge: 2023-06-17 | Disposition: A | Discharge status: Home / Self Care | Payer: Self-pay | Attending: Sports Medicine | Admitting: Sports Medicine

## 2023-06-17 DIAGNOSIS — J09X2 Influenza due to identified novel influenza A virus with other respiratory manifestations: Secondary | ICD-10-CM

## 2023-06-17 LAB — POCT INFLUENZA A/B
Influenza A, POC: POSITIVE — AB
Influenza B, POC: NEGATIVE

## 2023-06-17 MED ORDER — OSELTAMIVIR PHOSPHATE 75 MG PO CAPS: 75.0000 mg | ORAL_CAPSULE | Freq: Two times a day (BID) | ORAL | 0 refills | Status: AC

## 2023-06-17 MED ORDER — ACETAMINOPHEN 325 MG PO TABS
650.0000 mg | ORAL_TABLET | Freq: Once | ORAL | Status: AC
  Administered 2023-06-17: 650 mg via ORAL

## 2023-06-17 MED ORDER — ACETAMINOPHEN 325 MG PO TABS
ORAL_TABLET | ORAL | Status: AC
  Filled 2023-06-17: qty 2

## 2023-06-17 NOTE — ED Provider Notes (Signed)
 [facility]    CSN: [id number] Arrival date & time: 06/17/23  0800      History   Chief Complaint Chief Complaint  Patient presents with   Shortness of Breath   Headache    HPI Carlos Mcmahon is a 48 y.o. male.   Carlos Mcmahon is a 48yo male without significant PMH here with headache, sore throat, shortness of breath, fevers, and myalgias. Sxs started Monday night 06/15/23. He doesn't know of any sick contacts. He has tried to be taking tylenol , nyquil, and alka seltzer without much benefit. He also endorses some tinnitus of the right ear. Denies chest pain, leg swelling, diarrhea, abdominal pain.  The history is provided by the patient.  Shortness of Breath Associated symptoms: cough, ear pain, fever, headaches and sore throat   Associated symptoms: no abdominal pain, no chest pain and no vomiting   Headache Associated symptoms: congestion, cough, ear pain, fatigue, fever and sore throat   Associated symptoms: no abdominal pain, no diarrhea, no nausea and no vomiting     History reviewed. No pertinent past medical history.  There are no active problems to display for this patient.   Past Surgical History:  Procedure Laterality Date   APPENDECTOMY     TONSILLECTOMY         Home Medications    Prior to Admission medications   Medication Sig Start Date End Date Taking? Authorizing Provider  oseltamivir  (TAMIFLU ) 75 MG capsule Take 1 capsule (75 mg total) by mouth every 12 (twelve) hours. 06/17/23  Yes Caroline Matters D, MD  acetaminophen  (TYLENOL ) 500 MG tablet Take 1,000 mg by mouth every 4 (four) hours as needed (for fever).    [provider]  naproxen  (NAPROSYN ) 375 MG tablet Take 1 tablet (375 mg total) by mouth 2 (two) times daily with a meal. 01/01/19   Palumbo, April, MD  penicillin  v potassium (VEETID) 500 MG tablet Take 1 tablet (500 mg total) by mouth 4 (four) times daily. 01/01/19   Palumbo, April, MD    Family History History reviewed. No  pertinent family history.  Social History Social History   Tobacco Use   Smoking status: Never   Smokeless tobacco: Never  Substance Use Topics   Alcohol use: Yes    Comment: occasonally   Drug use: No     Allergies   Patient has no known allergies.   Review of Systems Review of Systems  Constitutional:  Positive for chills, fatigue and fever.  HENT:  Positive for congestion, ear pain, sore throat and tinnitus. Negative for trouble swallowing.   Respiratory:  Positive for cough and shortness of breath. Negative for chest tightness.   Cardiovascular:  Negative for chest pain and leg swelling.  Gastrointestinal:  Negative for abdominal pain, diarrhea, nausea and vomiting.  Neurological:  Positive for headaches.     Physical Exam Triage Vital Signs ED Triage Vitals  Encounter Vitals Group     BP 06/17/23 0826 (!) 167/93     Systolic BP Percentile --      Diastolic BP Percentile --      Pulse Rate 06/17/23 0826 98     Resp 06/17/23 0826 (!) 28     Temp 06/17/23 0826 (!) 101.2 F (38.4 C)     Temp Source 06/17/23 0826 Oral     SpO2 06/17/23 0826 96 %     Weight --      Height --      Head Circumference --  Peak Flow --      Pain Score 06/17/23 0824 7     Pain Loc --      Pain Education --      Exclude from Growth Chart --    No data found.  Updated Vital Signs BP (!) 167/93 (BP Location: Left Arm)   Pulse 98   Temp (!) 101.2 F (38.4 C) (Oral)   Resp (!) 28   SpO2 96%   Visual Acuity Right Eye Distance:   Left Eye Distance:   Bilateral Distance:    Right Eye Near:   Left Eye Near:    Bilateral Near:     Physical Exam Constitutional:      General: He is not in acute distress.    Appearance: He is well-developed and normal weight. He is ill-appearing.  HENT:     Head: Normocephalic and atraumatic.     Mouth/Throat:     Mouth: Mucous membranes are moist.     Pharynx: Pharyngeal swelling present.     Comments: Pharyngeal erythema without  exudates. Eyes:     Extraocular Movements: Extraocular movements intact.     Pupils: Pupils are equal, round, and reactive to light.  Cardiovascular:     Rate and Rhythm: Normal rate and regular rhythm.     Pulses: No decreased pulses.     Heart sounds: No murmur heard.    No friction rub. No gallop.  Pulmonary:     Effort: Pulmonary effort is normal. Tachypnea present. No respiratory distress.     Breath sounds: Normal breath sounds. No stridor. No decreased breath sounds, wheezing, rhonchi or rales.  Chest:     Chest wall: No deformity or tenderness.  Abdominal:     General: Bowel sounds are normal.     Palpations: Abdomen is soft.     Tenderness: There is no abdominal tenderness.  Musculoskeletal:     Cervical back: Normal range of motion and neck supple.  Lymphadenopathy:     Cervical: No cervical adenopathy.  Skin:    General: Skin is warm and dry.     Capillary Refill: Capillary refill takes less than 2 seconds.     Findings: No rash.  Neurological:     General: No focal deficit present.     Mental Status: He is alert and oriented to person, place, and time.     Cranial Nerves: No cranial nerve deficit.  Psychiatric:        Mood and Affect: Mood normal.        Behavior: Behavior normal.      UC Treatments / Results  Labs (all labs ordered are listed, but only abnormal results are displayed) Labs Reviewed  POCT INFLUENZA A/B - Abnormal; Notable for the following components:      Result Value   Influenza A, POC Positive (*)    All other components within normal limits    EKG   Radiology No results found.  Procedures Procedures (including critical care time)  Medications Ordered in UC Medications  acetaminophen  (TYLENOL ) tablet 650 mg (650 mg Oral Given 06/17/23 0834)    Initial Impression / Assessment and Plan / UC Course  I have reviewed the triage vital signs and the nursing notes.  Pertinent labs & imaging results that were available during my care  of the patient were reviewed by me and considered in my medical decision making (see chart for details).    Vitals and triage reviewed, patient is hemodynamically stable though is hypertensive  and tachypneic on intake. Mildly tachypneic during our exam RR of 22.   Rapid flu antigen testing + for Flu A. We discussed supportive care and starting antiviral therapy, Rx for Tamiflu  sent. Reviewed to start promptly as he is still within 48hr sx onset window. Encouraged rest and aggressive hydration in addition to tylenol  and motrin for sore throat/headaches/fevers. Work note provided - out until 24+ hours without fever unassisted from antipyretics. Return precautions reviewed. Patient's questions were answered and they are in agreement with this plan.   Final Clinical Impressions(s) / UC Diagnoses   Final diagnoses:  Influenza due to identified novel influenza A virus with other respiratory manifestations     Discharge Instructions      You have influenza, a viral infection that affects the respiratory system. It is important to start antiviral treatment within 48 hours of symptom onset for maximum effectiveness.  Medication Instructions:  Oseltamivir  (Tamiflu ): Take 75 mg twice daily for 5 days. If you miss a dose, take it as soon as you remember unless it is within 2 hours of your next dose. Do not double up on doses.  Potential Side Effects:  Be aware of possible side effects such as nausea, vomiting, and headache. Serious side effects include severe allergic reactions and neuropsychiatric events. Seek immediate medical attention if you experience any severe reactions.  Prevention and Hygiene:  Hand Hygiene: Wash your hands frequently with soap and water or use an alcohol-based hand sanitizer containing at least 60% alcohol.  Cough Etiquette: Cover your mouth and nose with a tissue or your elbow when coughing or sneezing. Dispose of tissues properly and wash your hands immediately.  Avoid  Close Contact: Stay away from others as much as possible to prevent spreading the virus. Avoid crowded places and close contact with individuals, especially those at high risk for complications.[5-6]  Vaccination:  Annual influenza vaccination is recommended for everyone aged 6 months and older. Vaccination is the best method to prevent influenza and its complications.  Return Precautions:  If you experience difficulty breathing, chest pain, severe weakness, or confusion, seek medical attention immediately. These could be signs of complications such as pneumonia or worsening of chronic medical conditions.  Follow-Up:  Schedule a follow-up appointment with your healthcare provider if your symptoms do not improve within a few days or if you have any concerns about your condition.  Additional Instructions:  Rest and stay hydrated. Drink plenty of fluids to stay hydrated and get plenty of rest to help your body fight the infection.  Continue alternation of tylenol  and ibuprofen for fever, headache, body aches.Take these every 6 hour as needed.  Avoid alcohol and tobacco, as these can worsen your symptoms and delay recovery.   ED Prescriptions     Medication Sig Dispense Auth. Provider   oseltamivir  (TAMIFLU ) 75 MG capsule Take 1 capsule (75 mg total) by mouth every 12 (twelve) hours. 10 capsule Carlos Mcmahon D, MD      PDMP not reviewed this encounter.   Levorn Prentice BIRCH, MD 06/17/23 [id number]

## 2023-06-17 NOTE — ED Triage Notes (Addendum)
 Pt c/o of chest congestion, sore throat, headache, chills, fever, cough, shortness of breath, ringing in ear, and yesterday he tasted blood when coughing.   Start Date: 2/2/20252 days   Home Interventions: Nyquil, Ala Seltzer Cold Plus, and Tylenol  (Last dose yesterday)

## 2023-06-17 NOTE — Discharge Instructions (Addendum)
 You have influenza, a viral infection that affects the respiratory system. It is important to start antiviral treatment within 48 hours of symptom onset for maximum effectiveness.  Medication Instructions:  Oseltamivir  (Tamiflu ): Take 75 mg twice daily for 5 days. If you miss a dose, take it as soon as you remember unless it is within 2 hours of your next dose. Do not double up on doses.  Potential Side Effects:  Be aware of possible side effects such as nausea, vomiting, and headache. Serious side effects include severe allergic reactions and neuropsychiatric events. Seek immediate medical attention if you experience any severe reactions.  Prevention and Hygiene:  Hand Hygiene: Wash your hands frequently with soap and water or use an alcohol-based hand sanitizer containing at least 60% alcohol.  Cough Etiquette: Cover your mouth and nose with a tissue or your elbow when coughing or sneezing. Dispose of tissues properly and wash your hands immediately.  Avoid Close Contact: Stay away from others as much as possible to prevent spreading the virus. Avoid crowded places and close contact with individuals, especially those at high risk for complications.[5-6]  Vaccination:  Annual influenza vaccination is recommended for everyone aged 6 months and older. Vaccination is the best method to prevent influenza and its complications.  Return Precautions:  If you experience difficulty breathing, chest pain, severe weakness, or confusion, seek medical attention immediately. These could be signs of complications such as pneumonia or worsening of chronic medical conditions.  Follow-Up:  Schedule a follow-up appointment with your healthcare provider if your symptoms do not improve within a few days or if you have any concerns about your condition.  Additional Instructions:  Rest and stay hydrated. Drink plenty of fluids to stay hydrated and get plenty of rest to help your body fight the infection.  Continue  alternation of tylenol  and ibuprofen for fever, headache, body aches.Take these every 6 hour as needed.  Avoid alcohol and tobacco, as these can worsen your symptoms and delay recovery.
